# Patient Record
Sex: Male | Born: 2000 | Race: White | Hispanic: No | Marital: Single | Smoking: Never smoker
Health system: Southern US, Community
[De-identification: ages and names within clinical notes are randomized; demographics above are authoritative.]

## PROBLEM LIST (undated history)

## (undated) HISTORY — PX: HIP FRACTURE SURGERY: SHX118

---
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
## 2020-05-16 ENCOUNTER — Ambulatory Visit: Payer: Self-pay | Admitting: Physician Assistant

## 2020-05-16 ENCOUNTER — Encounter: Payer: Self-pay | Admitting: Physician Assistant

## 2020-05-16 ENCOUNTER — Other Ambulatory Visit: Payer: Self-pay

## 2020-05-16 DIAGNOSIS — Z113 Encounter for screening for infections with a predominantly sexual mode of transmission: Secondary | ICD-10-CM

## 2020-05-16 LAB — GRAM STAIN

## 2020-05-16 NOTE — Progress Notes (Signed)
   [facility] Department STI clinic/screening visit  Subjective:  Joshua Gallagher is a 19 y.o. male being seen today for an STI screening visit. The patient reports they do not have symptoms.    Patient has the following medical conditions:  There are no problems to display for this patient.    Chief Complaint  Patient presents with  . SEXUALLY TRANSMITTED DISEASE    screening    HPI  Patient reports that he is not having any symptoms but would like a screening today.  Denies chronic conditions and takes vitamin C daily.  Reports surgery on R hip after break in accident.  States that he has not had HIV testing previously.  Last void prior to sample collection for Gram stain was < 2 hr ago.   See flowsheet for further details and programmatic requirements.    The following portions of the patient's history were reviewed and updated as appropriate: allergies, current medications, past medical history, past social history, past surgical history and problem list.  Objective:  There were no vitals filed for this visit.  Physical Exam Constitutional:      General: He is not in acute distress.    Appearance: Normal appearance.  HENT:     Head: Normocephalic and atraumatic.     Comments: No nits, lice, or hair loss. No cervical supraclavicular or axillary adenopathy.    Mouth/Throat:     Mouth: Mucous membranes are moist.     Pharynx: Oropharynx is clear. No oropharyngeal exudate or posterior oropharyngeal erythema.  Eyes:     Conjunctiva/sclera: Conjunctivae normal.  Pulmonary:     Effort: Pulmonary effort is normal.  Abdominal:     Palpations: Abdomen is soft. There is no mass.     Tenderness: There is no abdominal tenderness. There is no guarding or rebound.  Genitourinary:    Penis: Normal.      Testes: Normal.     Comments: Pubic area without nits, lice, edema, erythema, lesions and inguinal adenopathy. Penis circumcised, without rash, lesions and discharge  at meatus. Musculoskeletal:     Cervical back: Neck supple. No tenderness.  Skin:    General: Skin is warm and dry.     Findings: No bruising, erythema, lesion or rash.  Neurological:     Mental Status: He is alert and oriented to person, place, and time.  Psychiatric:        Mood and Affect: Mood normal.        Behavior: Behavior normal.        Thought Content: Thought content normal.        Judgment: Judgment normal.       Assessment and Plan:  Joshua Gallagher is a 19 y.o. male presenting to the [facility] Department for STI screening  1. Screening for STD (sexually transmitted disease) Patient into clinic without symptoms. Declines blood work today. Rec condoms with all sex. Await test results.  Counseled that RN will call if needs to RTC for treatment once results are back. - Gram stain - Gonococcus culture     No follow-ups on file.  No future appointments.  Matt Holmes, PA

## 2020-05-16 NOTE — Progress Notes (Signed)
Gram stain reviewed, no tx per provider orders. Provider orders completed. 

## 2020-05-21 LAB — GONOCOCCUS CULTURE
